# Patient Record
Sex: Male | Born: 1995 | Race: White | Hispanic: No | Marital: Single | Smoking: Current every day smoker
Health system: Southern US, Community
[De-identification: ages and names within clinical notes are randomized; demographics above are authoritative.]

## PROBLEM LIST (undated history)

## (undated) DIAGNOSIS — I10 Essential (primary) hypertension: Secondary | ICD-10-CM

---
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
## 2018-03-01 ENCOUNTER — Emergency Department
Admission: EM | Admit: 2018-03-01 | Discharge: 2018-03-01 | Disposition: A | Discharge status: Home / Self Care | Payer: Self-pay | Attending: Emergency Medicine | Admitting: Emergency Medicine

## 2018-03-01 ENCOUNTER — Encounter: Payer: Self-pay | Admitting: Emergency Medicine

## 2018-03-01 ENCOUNTER — Other Ambulatory Visit: Payer: Self-pay

## 2018-03-01 DIAGNOSIS — H7291 Unspecified perforation of tympanic membrane, right ear: Secondary | ICD-10-CM | POA: Insufficient documentation

## 2018-03-01 DIAGNOSIS — F17210 Nicotine dependence, cigarettes, uncomplicated: Secondary | ICD-10-CM | POA: Insufficient documentation

## 2018-03-01 DIAGNOSIS — I10 Essential (primary) hypertension: Secondary | ICD-10-CM | POA: Insufficient documentation

## 2018-03-01 DIAGNOSIS — H9191 Unspecified hearing loss, right ear: Secondary | ICD-10-CM | POA: Insufficient documentation

## 2018-03-01 MED ORDER — TRAMADOL HCL 50 MG PO TABS: 50.0000 mg | ORAL_TABLET | Freq: Four times a day (QID) | ORAL | 0 refills | Status: AC | PRN

## 2018-03-01 MED ORDER — AMOXICILLIN 500 MG PO CAPS: 500.0000 mg | ORAL_CAPSULE | Freq: Three times a day (TID) | ORAL | 0 refills | Status: AC

## 2018-03-01 MED ORDER — FEXOFENADINE-PSEUDOEPHED ER 60-120 MG PO TB12: 1.0000 | ORAL_TABLET | Freq: Two times a day (BID) | ORAL | 0 refills | Status: AC

## 2018-03-01 NOTE — ED Triage Notes (Signed)
PT reports right ear pain since last night, c/o pain and has been using q-tips.  Reports decreased hearing.

## 2018-03-01 NOTE — ED Notes (Signed)
FIRST NURSE NOTE: Pt reports right ear pain and pressure.  Decreased hearing on the right.

## 2018-03-01 NOTE — ED Provider Notes (Signed)
[facility] Emergency Department Provider Note   ____________________________________________   First MD Initiated Contact with Patient 03/01/18 1404     (approximate)  I have reviewed the triage vital signs and the nursing notes.   HISTORY  Chief Complaint Otalgia    HPI Raymond Watts is a 22 y.o. male patient presents with right ear pain.  Patient states he was cleaning his ears out the shower of a Q-tip.  Patient states the door was open hit in his left elbow causing the Q-tip to go deep into his ear.  Patient state pain has persists since that incident.  Patient denies any bleeding from the ear.  Patient does report decreased hearing.  Past Medical History:  Diagnosis Date  . Hypertension     There are no active problems to display for this patient.   History reviewed. No pertinent surgical history.  Prior to Admission medications   Medication Sig Start Date End Date Taking? Authorizing Provider  amoxicillin (AMOXIL) 500 MG capsule Take 1 capsule (500 mg total) by mouth 3 (three) times daily. 03/01/18   Joni ReiningSmith, Ronald K, PA-C  fexofenadine-pseudoephedrine (ALLEGRA-D) 60-120 MG 12 hr tablet Take 1 tablet by mouth 2 (two) times daily. 03/01/18   Joni ReiningSmith, Ronald K, PA-C  traMADol (ULTRAM) 50 MG tablet Take 1 tablet (50 mg total) by mouth every 6 (six) hours as needed. 03/01/18 03/01/19  Joni ReiningSmith, Ronald K, PA-C    Allergies Patient has no known allergies.  No family history on file.  Social History Social History   Tobacco Use  . Smoking status: Current Every Day Smoker    Packs/day: 0.50    Types: Cigarettes  Substance Use Topics  . Alcohol use: No  . Drug use: Not on file    Review of Systems  Constitutional: No fever/chills Eyes: No visual changes. ENT: No sore throat. Cardiovascular: Denies chest pain. Respiratory: Denies shortness of breath. Gastrointestinal: No abdominal pain.  No nausea, no vomiting.  No diarrhea.  No  constipation. Genitourinary: Negative for dysuria. Musculoskeletal: Negative for back pain. Skin: Negative for rash. Neurological: Negative for headaches, focal weakness or numbness. Endocrine:Hypertension   ____________________________________________   PHYSICAL EXAM:  VITAL SIGNS: ED Triage Vitals  Enc Vitals Group     BP 03/01/18 1341 (!) 156/88     Pulse Rate 03/01/18 1341 62     Resp 03/01/18 1341 18     Temp 03/01/18 1341 98.1 F (36.7 C)     Temp Source 03/01/18 1341 Oral     SpO2 03/01/18 1341 97 %     Weight 03/01/18 1338 (!) 340 lb (154.2 kg)     Height 03/01/18 1338 5\' 9"  (1.753 m)     Head Circumference --      Peak Flow --      Pain Score 03/01/18 1338 6     Pain Loc --      Pain Edu? --      Excl. in GC? --    Constitutional: Alert and oriented. Well appearing and in no acute distress. EARS: Small ruptured TM right ear.  No drainage. Cardiovascular: Normal rate, regular rhythm. Grossly normal heart sounds.  Good peripheral circulation. Respiratory: Normal respiratory effort.  No retractions. Lungs CTAB. Gastrointestinal: Soft and nontender. No distention. No abdominal bruits. No CVA tenderness. Skin:  Skin is warm, dry and intact. No rash noted. Psychiatric: Mood and affect are normal. Speech and behavior are normal.  ____________________________________________   LABS (all labs ordered  are listed, but only abnormal results are displayed)  Labs Reviewed - No data to display ____________________________________________  EKG   ____________________________________________  RADIOLOGY  ED MD interpretation:    Official radiology report(s): No results found.  ____________________________________________   PROCEDURES  Procedure(s) performed: None  Procedures  Critical Care performed: No  ____________________________________________   INITIAL IMPRESSION / ASSESSMENT AND PLAN / ED COURSE  As part of my medical decision making, I  reviewed the following data within the electronic MEDICAL RECORD NUMBER    Patient presents for right ear pain secondary to a small puncture by a Q-tip last night.  Patient given discharge care instruction advised take medication directed.  Patient advised no improvement or worsening complaint to follow-up with the [place] [facility].     ____________________________________________   FINAL CLINICAL IMPRESSION(S) / ED DIAGNOSES  Final diagnoses:  Eardrum rupture, right     ED Discharge Orders         Ordered    amoxicillin (AMOXIL) 500 MG capsule  3 times daily     03/01/18 1416    traMADol (ULTRAM) 50 MG tablet  Every 6 hours PRN     03/01/18 1416    fexofenadine-pseudoephedrine (ALLEGRA-D) 60-120 MG 12 hr tablet  2 times daily     03/01/18 1416           Note:  This document was prepared using Dragon voice recognition software and may include unintentional dictation errors.    Joni ReiningSmith, Ronald K, PA-C 03/01/18 1432    Schaevitz, Myra Rudeavid Matthew, MD 03/01/18 [phone]

## 2018-03-01 NOTE — ED Notes (Signed)
See triage note  Presents with right ear pain for a few days  Provider in with pt on arrival

## 2022-05-16 ENCOUNTER — Other Ambulatory Visit: Payer: Self-pay

## 2022-05-16 ENCOUNTER — Emergency Department
Admission: EM | Admit: 2022-05-16 | Discharge: 2022-05-16 | Disposition: A | Discharge status: Home / Self Care | Payer: BC Managed Care – PPO | Attending: Emergency Medicine | Admitting: Emergency Medicine

## 2022-05-16 DIAGNOSIS — M26622 Arthralgia of left temporomandibular joint: Secondary | ICD-10-CM | POA: Diagnosis not present

## 2022-05-16 DIAGNOSIS — R519 Headache, unspecified: Secondary | ICD-10-CM | POA: Diagnosis present

## 2022-05-16 MED ORDER — KETOROLAC TROMETHAMINE 30 MG/ML IJ SOLN
30.0000 mg | Freq: Once | INTRAMUSCULAR | Status: AC
  Administered 2022-05-16: 30 mg via INTRAMUSCULAR
  Filled 2022-05-16: qty 1

## 2022-05-16 NOTE — Discharge Instructions (Addendum)
Please take Tylenol and ibuprofen/Advil for your pain.  It is safe to take them together, or to alternate them every few hours.  Take up to '1000mg'$  of Tylenol at a time, up to 4 times per day.  Do not take more than 4000 mg of Tylenol in 24 hours.  For ibuprofen, take 400-600 mg, 3 - 4 times per day.  Try the NSAIDs (ibuprofen/advil) in particular for a few days. If you're still having trouble by Monday, reach out to the ENT clinic.   If you have other worsening symptoms despite this, fevers, inability to hear.  Difficulty with vision, breathing or other concerns then please return to the ED

## 2022-05-16 NOTE — ED Triage Notes (Signed)
Reports pain to tempomandibular joint x 2 days without known injury. States telehealth appointment dx as an inflamed lymph node and prescribed abx. Reports painful to the touch. Denies fevers. Pt ambulatory to triage. Breathing unlabored speaking in full sentences.

## 2022-05-16 NOTE — ED Provider Notes (Signed)
[facility] Provider Note    Event Date/Time   First MD Initiated Contact with Patient 05/16/22 0205     (approximate)   History   Facial Pain   HPI  Raymond Watts is a 27 y.o. male who presents to the ED for evaluation of Facial Pain   Morbidly obese 27 year old presents to the ED with 3 days of left-sided atraumatic cheek pain.  He reports pain anterior to his left ear atraumatic over the past 3 days.  He had a telehealth visit who prescribed him Augmentin to address the possibility of parotitis.  He denies any pain with mastication, swallowing or speaking.  Pain to palpation only.  No hearing, vision changes.  He reports improvement with ibuprofen at home.  No neck stiffness or fevers, headaches or syncopal episodes or trauma  Physical Exam   Triage Vital Signs: ED Triage Vitals  Enc Vitals Group     BP 05/16/22 0117 (!) 167/111     Pulse Rate 05/16/22 0117 64     Resp 05/16/22 0117 18     Temp 05/16/22 0117 97.9 F (36.6 C)     Temp Source 05/16/22 0117 Oral     SpO2 05/16/22 0117 97 %     Weight 05/16/22 0116 (!) 320 lb (145.2 kg)     Height 05/16/22 0116 '6\' 1"'$  (1.854 m)     Head Circumference --      Peak Flow --      Pain Score 05/16/22 0116 8     Pain Loc --      Pain Edu? --      Excl. in [place]? --     Most recent vital signs: Vitals:   05/16/22 0117  BP: (!) 167/111  Pulse: 64  Resp: 18  Temp: 97.9 F (36.6 C)  SpO2: 97%    General: Awake, no distress.  CV:  Good peripheral perfusion.  Resp:  Normal effort.  Abd:  No distention.  MSK:  No deformity noted.  Neuro:  No focal deficits appreciated. Other:  Fully able to open his mouth and range his mandible, no intraoral lesions, loose teeth.  No signs of upper airway obstruction.  Uvula is midline.  No popping or clicking with mandibular ranging No external skin changes, erythema, signs of trauma or swelling.  Has localized tenderness over left TMJ. Bilateral ears are  symmetric without erythema, rash or tenderness to palpation.  TMs are clear bilaterally Cranial nerves intact No palpable cervical lymphadenopathy or submandibular lymphadenopathy   ED Results / Procedures / Treatments   Labs (all labs ordered are listed, but only abnormal results are displayed) Labs Reviewed - No data to display  EKG   RADIOLOGY   Official radiology report(s): No results found.  PROCEDURES and INTERVENTIONS:  Procedures  Medications  ketorolac (TORADOL) 30 MG/ML injection 30 mg (30 mg Intramuscular Given 05/16/22 0245)     IMPRESSION / MDM / ASSESSMENT AND PLAN / ED COURSE  I reviewed the triage vital signs and the nursing notes.  Differential diagnosis includes, but is not limited to, parotitis, TMJ dysfunction, sialoadenitis, viral syndrome  27 year old presents with facial pain that may represent TMJ dysfunction.  Looks well without concerning features.  We discussed NSAIDs and following up with ENT if this does not help after a few days.  We discussed return precautions.     FINAL CLINICAL IMPRESSION(S) / ED DIAGNOSES   Final diagnoses:  Arthralgia of left temporomandibular joint  Rx / DC Orders   ED Discharge Orders     None        Note:  This document was prepared using Dragon voice recognition software and may include unintentional dictation errors.   Vladimir Crofts, MD 05/16/22 [phone]
